# Patient Record
Sex: Male | Born: 1990 | Race: White | Hispanic: No | Marital: Single | Smoking: Never smoker
Health system: Southern US, Community
[De-identification: ages and names within clinical notes are randomized; demographics above are authoritative.]

---

## 2012-08-21 ENCOUNTER — Other Ambulatory Visit: Payer: Self-pay

## 2012-08-21 ENCOUNTER — Ambulatory Visit
Admission: RE | Admit: 2012-08-21 | Discharge: 2012-08-21 | Disposition: A | Discharge status: Home / Self Care | Payer: BC Managed Care – PPO | Source: Ambulatory Visit

## 2012-08-21 DIAGNOSIS — S6980XA Other specified injuries of unspecified wrist, hand and finger(s), initial encounter: Secondary | ICD-10-CM

## 2014-04-05 ENCOUNTER — Emergency Department (INDEPENDENT_AMBULATORY_CARE_PROVIDER_SITE_OTHER): Payer: BC Managed Care – PPO

## 2014-04-05 ENCOUNTER — Emergency Department (HOSPITAL_COMMUNITY)
Admission: EM | Admit: 2014-04-05 | Discharge: 2014-04-05 | Disposition: A | Discharge status: Home / Self Care | Payer: BC Managed Care – PPO | Source: Home / Self Care | Attending: Emergency Medicine | Admitting: Emergency Medicine

## 2014-04-05 ENCOUNTER — Encounter (HOSPITAL_COMMUNITY): Payer: Self-pay | Admitting: Emergency Medicine

## 2014-04-05 DIAGNOSIS — S9032XA Contusion of left foot, initial encounter: Secondary | ICD-10-CM

## 2014-04-05 DIAGNOSIS — Y9375 Activity, martial arts: Secondary | ICD-10-CM

## 2014-04-05 DIAGNOSIS — S9030XA Contusion of unspecified foot, initial encounter: Secondary | ICD-10-CM
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
## 2014-04-05 NOTE — Discharge Instructions (Signed)
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
## 2014-04-06 NOTE — ED Provider Notes (Signed)
Medical screening examination/treatment/procedure(s) were performed by non-physician practitioner and as supervising physician I was immediately available for consultation/collaboration.  Giulio Bertino, M.D.  Shere Eisenhart C Tyquarius Paglia, MD 04/06/14 0909 

## 2014-09-24 ENCOUNTER — Emergency Department (INDEPENDENT_AMBULATORY_CARE_PROVIDER_SITE_OTHER): Payer: BC Managed Care – PPO

## 2014-09-24 ENCOUNTER — Emergency Department (HOSPITAL_COMMUNITY)
Admission: EM | Admit: 2014-09-24 | Discharge: 2014-09-24 | Disposition: A | Discharge status: Home / Self Care | Payer: BC Managed Care – PPO | Source: Home / Self Care | Attending: Family Medicine | Admitting: Family Medicine

## 2014-09-24 ENCOUNTER — Encounter (HOSPITAL_COMMUNITY): Payer: Self-pay | Admitting: *Deleted

## 2014-09-24 DIAGNOSIS — S7012XA Contusion of left thigh, initial encounter: Secondary | ICD-10-CM

## 2014-09-24 DIAGNOSIS — S8012XA Contusion of left lower leg, initial encounter: Secondary | ICD-10-CM

## 2014-09-24 DIAGNOSIS — S80812A Abrasion, left lower leg, initial encounter: Secondary | ICD-10-CM
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
## 2014-09-24 NOTE — Discharge Instructions (Signed)
Your xrays were without any evidence of broken bones. You can expect to be stiff and sore for the next few days. Ice and ibuprofen for pain and swelling. Follow up if symptoms get worse. Contusion A contusion is a deep bruise. Contusions are the result of an injury that caused bleeding under the skin. The contusion may turn blue, purple, or yellow. Minor injuries will give you a painless contusion, but more severe contusions may stay painful and swollen for a few weeks.  CAUSES  A contusion is usually caused by a blow, trauma, or direct force to an area of the body. SYMPTOMS   Swelling and redness of the injured area.  Bruising of the injured area.  Tenderness and soreness of the injured area.  Pain. DIAGNOSIS  The diagnosis can be made by taking a history and physical exam. An X-ray, CT scan, or MRI may be needed to determine if there were any associated injuries, such as fractures. TREATMENT  Specific treatment will depend on what area of the body was injured. In general, the best treatment for a contusion is resting, icing, elevating, and applying cold compresses to the injured area. Over-the-counter medicines may also be recommended for pain control. Ask your caregiver what the best treatment is for your contusion. HOME CARE INSTRUCTIONS   Put ice on the injured area.  Put ice in a plastic bag.  Place a towel between your skin and the bag.  Leave the ice on for 15-20 minutes, 3-4 times a day, or as directed by your health care provider.  Only take over-the-counter or prescription medicines for pain, discomfort, or fever as directed by your caregiver. Your caregiver may recommend avoiding anti-inflammatory medicines (aspirin, ibuprofen, and naproxen) for 48 hours because these medicines may increase bruising.  Rest the injured area.  If possible, elevate the injured area to reduce swelling. SEEK IMMEDIATE MEDICAL CARE IF:   You have increased bruising or swelling.  You have  pain that is getting worse.  Your swelling or pain is not relieved with medicines. MAKE SURE YOU:   Understand these instructions.  Will watch your condition.  Will get help right away if you are not doing well or get worse. Document Released: 06/15/2005 Document Revised: 09/10/2013 Document Reviewed: 07/11/2011 [facility] Patient Information 2015 [place], [place]. This information is not intended to replace advice given to you by your health care provider. Make sure you discuss any questions you have with your health care provider.  Abrasion An abrasion is a cut or scrape of the skin. Abrasions do not extend through all layers of the skin and most heal within 10 days. It is important to care for your abrasion properly to prevent infection. CAUSES  Most abrasions are caused by falling on, or gliding across, the ground or other surface. When your skin rubs on something, the outer and inner layer of skin rubs off, causing an abrasion. DIAGNOSIS  Your caregiver will be able to diagnose an abrasion during a physical exam.  TREATMENT  Your treatment depends on how large and deep the abrasion is. Generally, your abrasion will be cleaned with water and a mild soap to remove any dirt or debris. An antibiotic ointment may be put over the abrasion to prevent an infection. A bandage (dressing) may be wrapped around the abrasion to keep it from getting dirty.  You may need a tetanus shot if:  You cannot remember when you had your last tetanus shot.  You have never had a tetanus shot.  The injury broke your skin. If you get a tetanus shot, your arm may swell, get red, and feel warm to the touch. This is common and not a problem. If you need a tetanus shot and you choose not to have one, there is a rare chance of getting tetanus. Sickness from tetanus can be serious.  HOME CARE INSTRUCTIONS   If a dressing was applied, change it at least once a day or as directed by your caregiver. If the bandage  sticks, soak it off with warm water.   Wash the area with water and a mild soap to remove all the ointment 2 times a day. Rinse off the soap and pat the area dry with a clean towel.   Reapply any ointment as directed by your caregiver. This will help prevent infection and keep the bandage from sticking. Use gauze over the wound and under the dressing to help keep the bandage from sticking.   Change your dressing right away if it becomes wet or dirty.   Only take over-the-counter or prescription medicines for pain, discomfort, or fever as directed by your caregiver.   Follow up with your caregiver within 24-48 hours for a wound check, or as directed. If you were not given a wound-check appointment, look closely at your abrasion for redness, swelling, or pus. These are signs of infection. SEEK IMMEDIATE MEDICAL CARE IF:   You have increasing pain in the wound.   You have redness, swelling, or tenderness around the wound.   You have pus coming from the wound.   You have a fever or persistent symptoms for more than 2-3 days.  You have a fever and your symptoms suddenly get worse.  You have a bad smell coming from the wound or dressing.  MAKE SURE YOU:   Understand these instructions.  Will watch your condition.  Will get help right away if you are not doing well or get worse. Document Released: 06/15/2005 Document Revised: 08/22/2012 Document Reviewed: 08/09/2011 [facility] Patient Information 2015 [place], [place]. This information is not intended to replace advice given to you by your health care provider. Make sure you discuss any questions you have with your health care provider.
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
